# Patient Record
Sex: Female | Born: 1935 | Race: White | Hispanic: No | Marital: Married
Health system: Southern US, Community
[De-identification: ages and names within clinical notes are randomized; demographics above are authoritative.]

---

## 2018-02-17 ENCOUNTER — Other Ambulatory Visit (HOSPITAL_COMMUNITY): Payer: Medicare Other

## 2018-02-17 ENCOUNTER — Inpatient Hospital Stay
Admission: RE | Admit: 2018-02-17 | Discharge: 2018-03-12 | Disposition: A | Discharge status: Hospice | Payer: Medicare Other | Source: Ambulatory Visit | Attending: Internal Medicine | Admitting: Internal Medicine

## 2018-02-17 DIAGNOSIS — K567 Ileus, unspecified: Secondary | ICD-10-CM

## 2018-02-17 DIAGNOSIS — N170 Acute kidney failure with tubular necrosis: Secondary | ICD-10-CM

## 2018-02-17 DIAGNOSIS — K56609 Unspecified intestinal obstruction, unspecified as to partial versus complete obstruction: Secondary | ICD-10-CM

## 2018-02-17 DIAGNOSIS — J9600 Acute respiratory failure, unspecified whether with hypoxia or hypercapnia: Secondary | ICD-10-CM

## 2018-02-17 DIAGNOSIS — J189 Pneumonia, unspecified organism: Secondary | ICD-10-CM

## 2018-02-17 DIAGNOSIS — J90 Pleural effusion, not elsewhere classified: Secondary | ICD-10-CM

## 2018-02-17 DIAGNOSIS — J811 Chronic pulmonary edema: Secondary | ICD-10-CM

## 2018-02-17 DIAGNOSIS — Z431 Encounter for attention to gastrostomy: Secondary | ICD-10-CM

## 2018-02-17 DIAGNOSIS — N179 Acute kidney failure, unspecified: Secondary | ICD-10-CM

## 2018-02-17 DIAGNOSIS — K5649 Other impaction of intestine: Secondary | ICD-10-CM

## 2018-02-18 ENCOUNTER — Other Ambulatory Visit (HOSPITAL_COMMUNITY): Payer: Medicare Other

## 2018-02-18 LAB — CBC WITH DIFFERENTIAL/PLATELET
Abs Immature Granulocytes: 0.15 10*3/uL — ABNORMAL HIGH (ref 0.00–0.07)
Basophils Absolute: 0 10*3/uL (ref 0.0–0.1)
Basophils Relative: 0 %
Eosinophils Absolute: 0 10*3/uL (ref 0.0–0.5)
Eosinophils Relative: 0 %
HCT: 32.2 % — ABNORMAL LOW (ref 36.0–46.0)
Hemoglobin: 9.5 g/dL — ABNORMAL LOW (ref 12.0–15.0)
Immature Granulocytes: 1 %
LYMPHS PCT: 5 %
Lymphs Abs: 0.8 10*3/uL (ref 0.7–4.0)
MCH: 30.5 pg (ref 26.0–34.0)
MCHC: 29.5 g/dL — ABNORMAL LOW (ref 30.0–36.0)
MCV: 103.5 fL — ABNORMAL HIGH (ref 80.0–100.0)
Monocytes Absolute: 0.6 10*3/uL (ref 0.1–1.0)
Monocytes Relative: 4 %
Neutro Abs: 14.2 10*3/uL — ABNORMAL HIGH (ref 1.7–7.7)
Neutrophils Relative %: 90 %
Platelets: 225 10*3/uL (ref 150–400)
RBC: 3.11 MIL/uL — AB (ref 3.87–5.11)
RDW: 17.5 % — ABNORMAL HIGH (ref 11.5–15.5)
WBC: 15.8 10*3/uL — AB (ref 4.0–10.5)
nRBC: 0.5 % — ABNORMAL HIGH (ref 0.0–0.2)

## 2018-02-18 LAB — OCCULT BLOOD GASTRIC / DUODENUM (SPECIMEN CUP): Occult Blood, Gastric: NEGATIVE

## 2018-02-18 LAB — COMPREHENSIVE METABOLIC PANEL
ALT: 225 U/L — ABNORMAL HIGH (ref 0–44)
AST: 48 U/L — ABNORMAL HIGH (ref 15–41)
Albumin: 2.7 g/dL — ABNORMAL LOW (ref 3.5–5.0)
Alkaline Phosphatase: 58 U/L (ref 38–126)
Anion gap: 17 — ABNORMAL HIGH (ref 5–15)
BUN: 105 mg/dL — ABNORMAL HIGH (ref 8–23)
CHLORIDE: 104 mmol/L (ref 98–111)
CO2: 26 mmol/L (ref 22–32)
Calcium: 8.8 mg/dL — ABNORMAL LOW (ref 8.9–10.3)
Creatinine, Ser: 3.26 mg/dL — ABNORMAL HIGH (ref 0.44–1.00)
GFR calc Af Amer: 14 mL/min — ABNORMAL LOW (ref >60)
GFR calc non Af Amer: 12 mL/min — ABNORMAL LOW (ref >60)
Glucose, Bld: 148 mg/dL — ABNORMAL HIGH (ref 70–99)
Potassium: 4.4 mmol/L (ref 3.5–5.1)
Sodium: 147 mmol/L — ABNORMAL HIGH (ref 135–145)
Total Bilirubin: 1.2 mg/dL (ref 0.3–1.2)
Total Protein: 5.4 g/dL — ABNORMAL LOW (ref 6.5–8.1)

## 2018-02-18 LAB — MAGNESIUM: Magnesium: 2.3 mg/dL (ref 1.7–2.4)

## 2018-02-18 LAB — PHOSPHORUS: Phosphorus: 6 mg/dL — ABNORMAL HIGH (ref 2.5–4.6)

## 2018-02-18 LAB — HEMOGLOBIN A1C
Hgb A1c MFr Bld: 5.1 % (ref 4.8–5.6)
Mean Plasma Glucose: 99.67 mg/dL

## 2018-02-18 LAB — BRAIN NATRIURETIC PEPTIDE

## 2018-02-18 LAB — TSH: TSH: 0.896 u[IU]/mL (ref 0.350–4.500)

## 2018-02-18 LAB — PROTIME-INR
INR: 1.92
Prothrombin Time: 21.7 seconds — ABNORMAL HIGH (ref 11.4–15.2)

## 2018-02-19 ENCOUNTER — Other Ambulatory Visit (HOSPITAL_COMMUNITY): Payer: Medicare Other

## 2018-02-19 LAB — RENAL FUNCTION PANEL
Albumin: 2.5 g/dL — ABNORMAL LOW (ref 3.5–5.0)
Anion gap: 13 (ref 5–15)
BUN: 90 mg/dL — ABNORMAL HIGH (ref 8–23)
CALCIUM: 8.4 mg/dL — AB (ref 8.9–10.3)
CO2: 30 mmol/L (ref 22–32)
Chloride: 102 mmol/L (ref 98–111)
Creatinine, Ser: 2.87 mg/dL — ABNORMAL HIGH (ref 0.44–1.00)
GFR calc Af Amer: 17 mL/min — ABNORMAL LOW (ref >60)
GFR calc non Af Amer: 14 mL/min — ABNORMAL LOW (ref >60)
Glucose, Bld: 131 mg/dL — ABNORMAL HIGH (ref 70–99)
Phosphorus: 5.7 mg/dL — ABNORMAL HIGH (ref 2.5–4.6)
Potassium: 3.7 mmol/L (ref 3.5–5.1)
SODIUM: 145 mmol/L (ref 135–145)

## 2018-02-19 LAB — CBC
HCT: 29.1 % — ABNORMAL LOW (ref 36.0–46.0)
Hemoglobin: 8.7 g/dL — ABNORMAL LOW (ref 12.0–15.0)
MCH: 30.7 pg (ref 26.0–34.0)
MCHC: 29.9 g/dL — ABNORMAL LOW (ref 30.0–36.0)
MCV: 102.8 fL — ABNORMAL HIGH (ref 80.0–100.0)
NRBC: 0.1 % (ref 0.0–0.2)
Platelets: 229 10*3/uL (ref 150–400)
RBC: 2.83 MIL/uL — ABNORMAL LOW (ref 3.87–5.11)
RDW: 17 % — ABNORMAL HIGH (ref 11.5–15.5)
WBC: 18.2 10*3/uL — AB (ref 4.0–10.5)

## 2018-02-19 LAB — MAGNESIUM: Magnesium: 2.2 mg/dL (ref 1.7–2.4)

## 2018-02-20 ENCOUNTER — Institutional Professional Consult (permissible substitution) (HOSPITAL_COMMUNITY): Payer: Medicare Other

## 2018-02-20 LAB — VANCOMYCIN, TROUGH: VANCOMYCIN TR: 21 ug/mL — AB (ref 15–20)

## 2018-02-20 LAB — CBC
HCT: 33.2 % — ABNORMAL LOW (ref 36.0–46.0)
HEMOGLOBIN: 10.1 g/dL — AB (ref 12.0–15.0)
MCH: 31.1 pg (ref 26.0–34.0)
MCHC: 30.4 g/dL (ref 30.0–36.0)
MCV: 102.2 fL — ABNORMAL HIGH (ref 80.0–100.0)
Platelets: 211 10*3/uL (ref 150–400)
RBC: 3.25 MIL/uL — ABNORMAL LOW (ref 3.87–5.11)
RDW: 16.2 % — ABNORMAL HIGH (ref 11.5–15.5)
WBC: 18.1 10*3/uL — ABNORMAL HIGH (ref 4.0–10.5)
nRBC: 0.1 % (ref 0.0–0.2)

## 2018-02-20 LAB — BASIC METABOLIC PANEL
Anion gap: 15 (ref 5–15)
BUN: 79 mg/dL — ABNORMAL HIGH (ref 8–23)
CALCIUM: 8.6 mg/dL — AB (ref 8.9–10.3)
CO2: 31 mmol/L (ref 22–32)
Chloride: 96 mmol/L — ABNORMAL LOW (ref 98–111)
Creatinine, Ser: 2.51 mg/dL — ABNORMAL HIGH (ref 0.44–1.00)
GFR calc non Af Amer: 17 mL/min — ABNORMAL LOW (ref >60)
GFR, EST AFRICAN AMERICAN: 20 mL/min — AB (ref >60)
Glucose, Bld: 124 mg/dL — ABNORMAL HIGH (ref 70–99)
Potassium: 3.9 mmol/L (ref 3.5–5.1)
Sodium: 142 mmol/L (ref 135–145)

## 2018-02-20 LAB — MAGNESIUM: Magnesium: 2.1 mg/dL (ref 1.7–2.4)

## 2018-02-21 ENCOUNTER — Other Ambulatory Visit (HOSPITAL_COMMUNITY): Payer: Medicare Other

## 2018-02-21 LAB — CBC
HCT: 32.4 % — ABNORMAL LOW (ref 36.0–46.0)
HCT: 33.9 % — ABNORMAL LOW (ref 36.0–46.0)
Hemoglobin: 9.8 g/dL — ABNORMAL LOW (ref 12.0–15.0)
Hemoglobin: 10.2 g/dL — ABNORMAL LOW (ref 12.0–15.0)
MCH: 30.9 pg (ref 26.0–34.0)
MCH: 31.4 pg (ref 26.0–34.0)
MCHC: 30.2 g/dL (ref 30.0–36.0)
MCHC: 30.1 g/dL (ref 30.0–36.0)
MCV: 102.2 fL — ABNORMAL HIGH (ref 80.0–100.0)
MCV: 104.3 fL — ABNORMAL HIGH (ref 80.0–100.0)
PLATELETS: 171 10*3/uL (ref 150–400)
Platelets: 194 10*3/uL (ref 150–400)
RBC: 3.17 MIL/uL — ABNORMAL LOW (ref 3.87–5.11)
RBC: 3.25 MIL/uL — ABNORMAL LOW (ref 3.87–5.11)
RDW: 16.1 % — AB (ref 11.5–15.5)
RDW: 15.9 % — AB (ref 11.5–15.5)
WBC: 16.2 10*3/uL — ABNORMAL HIGH (ref 4.0–10.5)
WBC: 19.2 10*3/uL — ABNORMAL HIGH (ref 4.0–10.5)
nRBC: 0 % (ref 0.0–0.2)
nRBC: 0 % (ref 0.0–0.2)

## 2018-02-21 LAB — MAGNESIUM: Magnesium: 2 mg/dL (ref 1.7–2.4)

## 2018-02-21 LAB — URINALYSIS, ROUTINE W REFLEX MICROSCOPIC
Bilirubin Urine: NEGATIVE
Glucose, UA: 50 mg/dL — AB
Ketones, ur: NEGATIVE mg/dL
Leukocytes, UA: NEGATIVE
Nitrite: NEGATIVE
Protein, ur: 30 mg/dL — AB
Specific Gravity, Urine: 1.015 (ref 1.005–1.030)
pH: 5 (ref 5.0–8.0)

## 2018-02-21 LAB — RENAL FUNCTION PANEL
ALBUMIN: 2.9 g/dL — AB (ref 3.5–5.0)
Anion gap: 13 (ref 5–15)
BUN: 71 mg/dL — AB (ref 8–23)
CO2: 31 mmol/L (ref 22–32)
Calcium: 8.6 mg/dL — ABNORMAL LOW (ref 8.9–10.3)
Chloride: 99 mmol/L (ref 98–111)
Creatinine, Ser: 2.23 mg/dL — ABNORMAL HIGH (ref 0.44–1.00)
GFR calc Af Amer: 23 mL/min — ABNORMAL LOW (ref >60)
GFR calc non Af Amer: 19 mL/min — ABNORMAL LOW (ref >60)
Glucose, Bld: 124 mg/dL — ABNORMAL HIGH (ref 70–99)
Phosphorus: 5.3 mg/dL — ABNORMAL HIGH (ref 2.5–4.6)
Potassium: 3.9 mmol/L (ref 3.5–5.1)
SODIUM: 143 mmol/L (ref 135–145)

## 2018-02-22 ENCOUNTER — Other Ambulatory Visit (HOSPITAL_COMMUNITY): Payer: Medicare Other

## 2018-02-22 ENCOUNTER — Institutional Professional Consult (permissible substitution) (HOSPITAL_COMMUNITY): Payer: Medicare Other

## 2018-02-22 LAB — RENAL FUNCTION PANEL
Albumin: 2.5 g/dL — ABNORMAL LOW (ref 3.5–5.0)
Anion gap: 11 (ref 5–15)
BUN: 62 mg/dL — ABNORMAL HIGH (ref 8–23)
CO2: 33 mmol/L — ABNORMAL HIGH (ref 22–32)
Calcium: 8.4 mg/dL — ABNORMAL LOW (ref 8.9–10.3)
Chloride: 104 mmol/L (ref 98–111)
Creatinine, Ser: 1.88 mg/dL — ABNORMAL HIGH (ref 0.44–1.00)
GFR calc non Af Amer: 24 mL/min — ABNORMAL LOW (ref >60)
GFR, EST AFRICAN AMERICAN: 28 mL/min — AB (ref >60)
Glucose, Bld: 119 mg/dL — ABNORMAL HIGH (ref 70–99)
Phosphorus: 4.5 mg/dL (ref 2.5–4.6)
Potassium: 3.7 mmol/L (ref 3.5–5.1)
Sodium: 148 mmol/L — ABNORMAL HIGH (ref 135–145)

## 2018-02-22 LAB — URINE CULTURE: Culture: NO GROWTH

## 2018-02-22 LAB — CBC
HCT: 30.6 % — ABNORMAL LOW (ref 36.0–46.0)
Hemoglobin: 9.1 g/dL — ABNORMAL LOW (ref 12.0–15.0)
MCH: 31 pg (ref 26.0–34.0)
MCHC: 29.7 g/dL — ABNORMAL LOW (ref 30.0–36.0)
MCV: 104.1 fL — ABNORMAL HIGH (ref 80.0–100.0)
Platelets: 148 10*3/uL — ABNORMAL LOW (ref 150–400)
RBC: 2.94 MIL/uL — AB (ref 3.87–5.11)
RDW: 16 % — ABNORMAL HIGH (ref 11.5–15.5)
WBC: 13.8 10*3/uL — ABNORMAL HIGH (ref 4.0–10.5)
nRBC: 0 % (ref 0.0–0.2)

## 2018-02-23 ENCOUNTER — Institutional Professional Consult (permissible substitution) (HOSPITAL_COMMUNITY): Payer: Medicare Other

## 2018-02-23 ENCOUNTER — Other Ambulatory Visit (HOSPITAL_COMMUNITY): Payer: Medicare Other

## 2018-02-23 LAB — BASIC METABOLIC PANEL
ANION GAP: 10 (ref 5–15)
BUN: 58 mg/dL — ABNORMAL HIGH (ref 8–23)
CO2: 33 mmol/L — AB (ref 22–32)
Calcium: 8.7 mg/dL — ABNORMAL LOW (ref 8.9–10.3)
Chloride: 108 mmol/L (ref 98–111)
Creatinine, Ser: 1.73 mg/dL — ABNORMAL HIGH (ref 0.44–1.00)
GFR calc Af Amer: 31 mL/min — ABNORMAL LOW (ref >60)
GFR calc non Af Amer: 26 mL/min — ABNORMAL LOW (ref >60)
Glucose, Bld: 153 mg/dL — ABNORMAL HIGH (ref 70–99)
Potassium: 3.6 mmol/L (ref 3.5–5.1)
Sodium: 151 mmol/L — ABNORMAL HIGH (ref 135–145)

## 2018-02-23 LAB — MAGNESIUM: Magnesium: 2 mg/dL (ref 1.7–2.4)

## 2018-02-23 LAB — PHOSPHORUS: Phosphorus: 3.2 mg/dL (ref 2.5–4.6)

## 2018-02-23 LAB — TRIGLYCERIDES: Triglycerides: 49 mg/dL (ref <150)

## 2018-02-23 MED ORDER — DIATRIZOATE MEGLUMINE & SODIUM 66-10 % PO SOLN
ORAL | Status: AC
  Filled 2018-02-23: qty 120

## 2018-02-24 ENCOUNTER — Other Ambulatory Visit (HOSPITAL_COMMUNITY): Payer: Medicare Other

## 2018-02-24 LAB — BASIC METABOLIC PANEL
Anion gap: 9 (ref 5–15)
BUN: 56 mg/dL — ABNORMAL HIGH (ref 8–23)
CO2: 34 mmol/L — AB (ref 22–32)
Calcium: 8.5 mg/dL — ABNORMAL LOW (ref 8.9–10.3)
Chloride: 108 mmol/L (ref 98–111)
Creatinine, Ser: 1.55 mg/dL — ABNORMAL HIGH (ref 0.44–1.00)
GFR calc Af Amer: 35 mL/min — ABNORMAL LOW (ref >60)
GFR calc non Af Amer: 30 mL/min — ABNORMAL LOW (ref >60)
Glucose, Bld: 145 mg/dL — ABNORMAL HIGH (ref 70–99)
Potassium: 4.3 mmol/L (ref 3.5–5.1)
Sodium: 151 mmol/L — ABNORMAL HIGH (ref 135–145)

## 2018-02-24 LAB — MAGNESIUM: Magnesium: 1.9 mg/dL (ref 1.7–2.4)

## 2018-02-25 ENCOUNTER — Institutional Professional Consult (permissible substitution) (HOSPITAL_COMMUNITY): Payer: Medicare Other

## 2018-02-25 LAB — URINALYSIS, ROUTINE W REFLEX MICROSCOPIC
Bilirubin Urine: NEGATIVE
Glucose, UA: 500 mg/dL — AB
Ketones, ur: NEGATIVE mg/dL
Nitrite: NEGATIVE
PROTEIN: NEGATIVE mg/dL
Specific Gravity, Urine: 1.01 (ref 1.005–1.030)
pH: 6 (ref 5.0–8.0)

## 2018-02-25 LAB — RENAL FUNCTION PANEL
ANION GAP: 8 (ref 5–15)
Albumin: 2.3 g/dL — ABNORMAL LOW (ref 3.5–5.0)
BUN: 56 mg/dL — ABNORMAL HIGH (ref 8–23)
CO2: 30 mmol/L (ref 22–32)
Calcium: 8.1 mg/dL — ABNORMAL LOW (ref 8.9–10.3)
Chloride: 108 mmol/L (ref 98–111)
Creatinine, Ser: 1.32 mg/dL — ABNORMAL HIGH (ref 0.44–1.00)
GFR calc Af Amer: 43 mL/min — ABNORMAL LOW (ref >60)
GFR calc non Af Amer: 37 mL/min — ABNORMAL LOW (ref >60)
Glucose, Bld: 135 mg/dL — ABNORMAL HIGH (ref 70–99)
PHOSPHORUS: 3.3 mg/dL (ref 2.5–4.6)
POTASSIUM: 4 mmol/L (ref 3.5–5.1)
Sodium: 146 mmol/L — ABNORMAL HIGH (ref 135–145)

## 2018-02-25 LAB — URINALYSIS, MICROSCOPIC (REFLEX)

## 2018-02-25 LAB — CBC
HCT: 29.4 % — ABNORMAL LOW (ref 36.0–46.0)
Hemoglobin: 8.7 g/dL — ABNORMAL LOW (ref 12.0–15.0)
MCH: 31.2 pg (ref 26.0–34.0)
MCHC: 29.6 g/dL — ABNORMAL LOW (ref 30.0–36.0)
MCV: 105.4 fL — AB (ref 80.0–100.0)
Platelets: 104 10*3/uL — ABNORMAL LOW (ref 150–400)
RBC: 2.79 MIL/uL — ABNORMAL LOW (ref 3.87–5.11)
RDW: 15.8 % — ABNORMAL HIGH (ref 11.5–15.5)
WBC: 14.3 10*3/uL — ABNORMAL HIGH (ref 4.0–10.5)
nRBC: 0 % (ref 0.0–0.2)

## 2018-02-25 LAB — MAGNESIUM: Magnesium: 1.9 mg/dL (ref 1.7–2.4)

## 2018-02-26 ENCOUNTER — Other Ambulatory Visit (HOSPITAL_COMMUNITY): Payer: Medicare Other

## 2018-02-26 LAB — CBC
HCT: 28.7 % — ABNORMAL LOW (ref 36.0–46.0)
Hemoglobin: 8.7 g/dL — ABNORMAL LOW (ref 12.0–15.0)
MCH: 31.8 pg (ref 26.0–34.0)
MCHC: 30.3 g/dL (ref 30.0–36.0)
MCV: 104.7 fL — ABNORMAL HIGH (ref 80.0–100.0)
Platelets: 93 10*3/uL — ABNORMAL LOW (ref 150–400)
RBC: 2.74 MIL/uL — ABNORMAL LOW (ref 3.87–5.11)
RDW: 15.6 % — ABNORMAL HIGH (ref 11.5–15.5)
WBC: 15 10*3/uL — ABNORMAL HIGH (ref 4.0–10.5)
nRBC: 0 % (ref 0.0–0.2)

## 2018-02-26 LAB — RENAL FUNCTION PANEL
Albumin: 2.4 g/dL — ABNORMAL LOW (ref 3.5–5.0)
Anion gap: 7 (ref 5–15)
BUN: 61 mg/dL — ABNORMAL HIGH (ref 8–23)
CHLORIDE: 105 mmol/L (ref 98–111)
CO2: 29 mmol/L (ref 22–32)
Calcium: 8 mg/dL — ABNORMAL LOW (ref 8.9–10.3)
Creatinine, Ser: 1.34 mg/dL — ABNORMAL HIGH (ref 0.44–1.00)
GFR calc Af Amer: 42 mL/min — ABNORMAL LOW (ref >60)
GFR calc non Af Amer: 36 mL/min — ABNORMAL LOW (ref >60)
Glucose, Bld: 118 mg/dL — ABNORMAL HIGH (ref 70–99)
Phosphorus: 4.3 mg/dL (ref 2.5–4.6)
Potassium: 4.9 mmol/L (ref 3.5–5.1)
Sodium: 141 mmol/L (ref 135–145)

## 2018-02-26 LAB — URINE CULTURE: Culture: <10000 — AB

## 2018-02-26 LAB — T4, FREE: Free T4: 2.42 ng/dL — ABNORMAL HIGH (ref 0.82–1.77)

## 2018-02-26 LAB — TSH: TSH: 0.638 u[IU]/mL (ref 0.350–4.500)

## 2018-02-26 LAB — MAGNESIUM: MAGNESIUM: 2.1 mg/dL (ref 1.7–2.4)

## 2018-02-28 LAB — C DIFFICILE QUICK SCREEN W PCR REFLEX
C DIFFICILE (CDIFF) INTERP: NOT DETECTED
C Diff antigen: NEGATIVE
C Diff toxin: NEGATIVE

## 2018-03-01 ENCOUNTER — Institutional Professional Consult (permissible substitution) (HOSPITAL_COMMUNITY): Payer: Medicare Other

## 2018-03-01 LAB — MAGNESIUM: Magnesium: 1.9 mg/dL (ref 1.7–2.4)

## 2018-03-01 LAB — RENAL FUNCTION PANEL
Albumin: 2.5 g/dL — ABNORMAL LOW (ref 3.5–5.0)
Anion gap: 11 (ref 5–15)
BUN: 79 mg/dL — ABNORMAL HIGH (ref 8–23)
CO2: 26 mmol/L (ref 22–32)
CREATININE: 1.29 mg/dL — AB (ref 0.44–1.00)
Calcium: 8.3 mg/dL — ABNORMAL LOW (ref 8.9–10.3)
Chloride: 105 mmol/L (ref 98–111)
GFR calc Af Amer: 44 mL/min — ABNORMAL LOW (ref >60)
GFR, EST NON AFRICAN AMERICAN: 38 mL/min — AB (ref >60)
Glucose, Bld: 120 mg/dL — ABNORMAL HIGH (ref 70–99)
Phosphorus: 5.6 mg/dL — ABNORMAL HIGH (ref 2.5–4.6)
Potassium: 3.8 mmol/L (ref 3.5–5.1)
Sodium: 142 mmol/L (ref 135–145)

## 2018-03-01 LAB — CBC
HCT: 29 % — ABNORMAL LOW (ref 36.0–46.0)
Hemoglobin: 8.6 g/dL — ABNORMAL LOW (ref 12.0–15.0)
MCH: 30.8 pg (ref 26.0–34.0)
MCHC: 29.7 g/dL — ABNORMAL LOW (ref 30.0–36.0)
MCV: 103.9 fL — ABNORMAL HIGH (ref 80.0–100.0)
Platelets: 91 10*3/uL — ABNORMAL LOW (ref 150–400)
RBC: 2.79 MIL/uL — AB (ref 3.87–5.11)
RDW: 16 % — ABNORMAL HIGH (ref 11.5–15.5)
WBC: 13.9 10*3/uL — ABNORMAL HIGH (ref 4.0–10.5)
nRBC: 0.5 % — ABNORMAL HIGH (ref 0.0–0.2)

## 2018-03-02 ENCOUNTER — Other Ambulatory Visit (HOSPITAL_COMMUNITY): Payer: Medicare Other

## 2018-03-02 LAB — TRIGLYCERIDES: Triglycerides: 44 mg/dL (ref <150)

## 2018-03-02 NOTE — Consult Note (Signed)
Chief Complaint: Patient was seen in consultation today for percutaneous gastric tube placement at the request of Dr Ardeth Sportsman   Supervising Physician: Richarda Overlie  Patient Status: Select IP  History of Present Illness: Bailey Bartlett is a 84 y.o. female   HTN; CHF Resp Failure General weakness Dysphagia Deconditioning Protein calorie malnutrition Need for long term care  Request for percutaneous gastric tube placement   No past medical history on file.    Allergies: Patient has no allergy information on record.  Medications: Prior to Admission medications   Not on File     No family history on file.  Social History   Socioeconomic History  . Marital status: Married    Spouse name: Not on file  . Number of children: Not on file  . Years of education: Not on file  . Highest education level: Not on file  Occupational History  . Not on file  Social Needs  . Financial resource strain: Not on file  . Food insecurity:    Worry: Not on file    Inability: Not on file  . Transportation needs:    Medical: Not on file    Non-medical: Not on file  Tobacco Use  . Smoking status: Not on file  Substance and Sexual Activity  . Alcohol use: Not on file  . Drug use: Not on file  . Sexual activity: Not on file  Lifestyle  . Physical activity:    Days per week: Not on file    Minutes per session: Not on file  . Stress: Not on file  Relationships  . Social connections:    Talks on phone: Not on file    Gets together: Not on file    Attends religious service: Not on file    Active member of club or organization: Not on file    Attends meetings of clubs or organizations: Not on file    Relationship status: Not on file  Other Topics Concern  . Not on file  Social History Narrative  . Not on file    Review of Systems: A 12 point ROS discussed and pertinent positives are indicated in the HPI above.  All other systems are negative.   Vital Signs: LMP   (LMP Unknown)   Physical Exam Vitals signs reviewed.  Cardiovascular:     Rate and Rhythm: Normal rate and regular rhythm.  Pulmonary:     Effort: Pulmonary effort is normal.     Breath sounds: Wheezing present.  Abdominal:     General: Bowel sounds are normal.  Musculoskeletal:     Comments: Moves all 4s to command  Skin:    General: Skin is warm and dry.  Neurological:     Mental Status: She is disoriented.  Psychiatric:     Comments: Consented with Dtr Claris Che via phone     Imaging: Ct Abdomen Pelvis Wo Contrast  Result Date: 02/20/2018 CLINICAL DATA:  Abdominal discomfort, bowel obstruction EXAM: CT ABDOMEN AND PELVIS WITHOUT CONTRAST TECHNIQUE: Multidetector CT imaging of the abdomen and pelvis was performed following the standard protocol without IV contrast. COMPARISON:  Abdominal radiograph dated 02/19/2018 FINDINGS: Lower chest: Small right pleural effusion. Associated patchy right lower lobe opacity, likely compressive atelectasis. Hepatobiliary: Unenhanced liver is unremarkable. Status post cholecystectomy. No intrahepatic or extrahepatic ductal dilatation. Pancreas: Within normal limits. Spleen: Within normal limits. Adrenals/Urinary Tract: Adrenal glands are within normal limits. 2.5 cm posterior left upper pole renal cyst (series 3/image 33), measuring simple  fluid density. Right kidney is within normal limits. No renal calculi or hydronephrosis. Bladder is obscured by streak artifact. Stomach/Bowel: Enteric tube terminates in the distal gastric body. Multiple dilated loops of small bowel with transition in the right mid abdomen (series 3/image 40). Distal/terminal ileum is decompressed. Normal appendix (series 3/image 41). Colon is relatively decompressed with mild colonic stool burden. Overall appearance is compatible with at least partial small bowel obstruction. No pneumatosis. No drainable fluid collection/abscess. No free air. Vascular/Lymphatic: No evidence of  abdominal aortic aneurysm. Atherosclerotic calcifications of the abdominal aorta and branch vessels. IVC filter. No suspicious abdominopelvic lymphadenopathy. Reproductive: Uterus is unremarkable. Bilateral ovaries are unremarkable. Other: Small volume pelvic ascites. Musculoskeletal: Bilateral hip arthroplasties. Old bilateral superior and right inferior pubic rami fractures. IMPRESSION: Dilated loops of small bowel with transition involving distal/terminal ileum in the right mid abdomen, compatible with at least partial small bowel obstruction. Small volume pelvic ascites. No pneumatosis. No drainable fluid collection/abscess. No free air. Small right pleural effusion. Associated patchy right lower lobe opacity, likely compressive atelectasis. Electronically Signed   By: Charline BillsSriyesh  Krishnan M.D.   On: 02/20/2018 15:57   Ct Abdomen Wo Contrast  Result Date: 03/02/2018 CLINICAL DATA:  84 year old female with dysphagia in need of percutaneous gastrostomy tube for nutrition. Evaluate for anatomic suitability. Recent partial small bowel obstruction. EXAM: CT ABDOMEN WITHOUT CONTRAST TECHNIQUE: Multidetector CT imaging of the abdomen was performed following the standard protocol without IV contrast. COMPARISON:  Recent prior CT scan of the abdomen and pelvis 02/20/2018 FINDINGS: Lower chest: Small layering right pleural effusion. Expected associated right lower lobe atelectasis. Patchy ground-glass attenuation airspace opacity in the left lower lobe is new compared to prior. Stable cardiomegaly with right atrial dilatation. The visualized thoracic esophagus conveys a gastric tube into the stomach. No esophageal thickening. Hepatobiliary: No focal liver abnormality is seen. Status post cholecystectomy. No biliary dilatation. Pancreas: Unremarkable. No pancreatic ductal dilatation or surrounding inflammatory changes. Spleen: Normal in size without focal abnormality. Adrenals/Urinary Tract: Normal adrenal glands. No  evidence of hydronephrosis or nephrolithiasis. Water attenuation cystic lesion in the interpolar left kidney is incompletely evaluated in the absence of intravenous contrast but statistically likely to represent a benign cyst. Stomach/Bowel: Interval resolution of small bowel obstruction. Normal gastric anatomy. No focal bowel wall thickening. Normal appendix in the right lower quadrant. Vascular/Lymphatic: Limited evaluation in the absence of intravenous contrast. Extensive atherosclerotic calcifications including of the aorta. A non retrievable permanent Simon Nitinol IVC filter is present. There is penetration of the struts. No strut complication visualized. No suspicious lymphadenopathy. Other: Bilateral calcified breast augmentation prostheses. Probable rupture of the left prosthesis with associated skin thickening. Musculoskeletal: No acute fracture or aggressive appearing lytic or blastic osseous lesion. IMPRESSION: 1. Anatomy is suitable for gastrostomy tube placement. 2. New patchy ground-glass attenuation airspace opacity in the left lower lobe concerning for bronchopneumonia versus aspiration. 3. Ruptured left breast augmentation prosthesis with reticulation of the overlying subcutaneous fat and focal skin thickening. Recommend clinical correlation for evidence of local irritation, cellulitis, or mass/malignancy. 4. Non retrievable Simon Nitinol IVC filter in place. Electronically Signed   By: Malachy MoanHeath  McCullough M.D.   On: 03/02/2018 08:04   Dg Abd 1 View  Result Date: 02/17/2018 CLINICAL DATA:  Bowel impaction EXAM: ABDOMEN - 1 VIEW COMPARISON:  None. FINDINGS: Diffuse gaseous distention of bowel, most notable in the colon. Findings likely reflect ileus. No free air or organomegaly. IVC filter in place. Prior cholecystectomy. Vascular calcifications. Bilateral hip replacements. No  acute bony abnormality. IMPRESSION: Diffuse gaseous distention of bowel, most pronounced in the large bowel, likely  ileus. Electronically Signed   By: Charlett Nose M.D.   On: 02/17/2018 20:42   US Renal  Result Date: 02/18/2018 CLINICAL DATA:  Acute kidney injury. EXAM: RENAL / URINARY TRACT ULTRASOUND COMPLETE COMPARISON:  None. FINDINGS: Right Kidney: Renal measurements: 11.2 x 9.6 x 5.9 cm = volume: 125 mL . Echogenicity within normal limits. No mass or hydronephrosis visualized. Left Kidney: Renal measurements: 10.1 x 5.9 x 4.3 cm = volume: 133 mL. Echogenicity within normal limits. 2.4 cm complex cystic abnormality is noted in upper pole most consistent with Bosniak type 2 cyst. No hydronephrosis visualized. Bladder: Decompressed secondary to Foley catheter. IMPRESSION: 2.4 cm complex cystic abnormality seen in upper pole of left kidney most consistent with Bosniak type 2 cyst. Imaging is somewhat limited due to body habitus. Follow-up ultrasound in 1 year is recommended to ensure stability. No other definite renal abnormality is noted. Electronically Signed   By: Lupita Raider, M.D.   On: 02/18/2018 15:34   Dg Chest Port 1 View  Result Date: 02/23/2018 CLINICAL DATA:  Pulmonary edema, shortness of breath, wheezing EXAM: PORTABLE CHEST 1 VIEW COMPARISON:  02/20/2018 FINDINGS: Right PICC line and NG tube remain in place, unchanged. Cardiomegaly, vascular congestion and bibasilar atelectasis. Improved aeration in the lung bases since prior study. IMPRESSION: Cardiomegaly with vascular congestion. Improving bibasilar atelectasis. Electronically Signed   By: Charlett Nose M.D.   On: 02/23/2018 09:02   Dg Chest Port 1 View  Result Date: 02/20/2018 CLINICAL DATA:  Pulmonary edema, effusion, shortness of breath EXAM: PORTABLE CHEST 1 VIEW COMPARISON:  02/17/2018 FINDINGS: Right PICC line remains in place, unchanged. Interval placement of NG tube into the stomach. Cardiomegaly. Bibasilar atelectasis or infiltrates, similar on the left, increasing on the right. There may be a small right pleural effusion. No acute  bony abnormality. Stable deformity of the proximal right humerus related to old healed fracture. IMPRESSION: Bibasilar opacities, right greater than left could reflect atelectasis or infiltrates. Possible small right effusion. Electronically Signed   By: Charlett Nose M.D.   On: 02/20/2018 08:34   Dg Chest Port 1 View  Result Date: 02/17/2018 CLINICAL DATA:  Pneumonia, respiratory failure EXAM: PORTABLE CHEST 1 VIEW COMPARISON:  None. FINDINGS: Cardiomegaly. Patchy left lower lung airspace opacity concerning for pneumonia. No confluent opacity on the right. No effusions. Right PICC line in place with the tip at the cavoatrial junction. IMPRESSION: Cardiomegaly. Left lower lobe airspace opacity concerning for pneumonia. Electronically Signed   By: Charlett Nose M.D.   On: 02/17/2018 20:41   Dg Abd Portable 1v  Result Date: 02/26/2018 CLINICAL DATA:  Ileus EXAM: PORTABLE ABDOMEN - 1 VIEW COMPARISON:  February 25, 2018 FINDINGS: Nasogastric tube tip and side port are in the stomach. There is currently no appreciable bowel dilatation or air-fluid level to suggest bowel obstruction. No free air. Inferior vena cava position unchanged. 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 Lung bases are clear. Calcified breast implants noted. IMPRESSION: Nasogastric tube tip and side port in stomach. No bowel obstruction or free air evident. Lung bases clear. Postoperative changes noted. Electronically Signed   By: Bretta Bang III M.D.   On: 02/26/2018 07:24   Dg Abd Portable 1v  Result Date: 02/25/2018 CLINICAL DATA:  Small-bowel obstruction EXAM: PORTABLE ABDOMEN - 1 VIEW COMPARISON:  02/24/2018 FINDINGS: NG in the stomach with the tip in the antrum unchanged. Normal bowel  gas pattern. Improvement in small bowel dilatation compared with prior study. Decreased contrast in the colon. IVC filter unchanged the L3-4 level. Bilateral hip replacement. Chronic right pelvic fracture. IMPRESSION: NG tube remains in the  gastric antrum. Improvement in small bowel dilatation with passage of colonic contrast since the prior study. Electronically Signed   By: Marlan Palauharles  Clark M.D.   On: 02/25/2018 08:35   Dg Abd Portable 1v  Result Date: 02/24/2018 CLINICAL DATA:  Small bowel obstruction. EXAM: PORTABLE ABDOMEN - 1 VIEW COMPARISON:  02/23/2018 FINDINGS: Nasogastric tube courses through the stomach with tip over the distal stomach just right of midline. IVC filter unchanged with tip at the level of the L3 superior endplate. Bowel gas pattern demonstrates air and contrast throughout the colon. There are a few air-filled mildly dilated small bowel loops in the right mid abdomen with slight interval improvement. Findings are not significantly changed. No free peritoneal air. Remainder of the exam is unchanged. IMPRESSION: Persistent air-filled minimally dilated small bowel loops in the right mid abdomen with slight interval improvement. Air and contrast throughout the colon. Nasogastric tube with tip right of midline likely over the distal stomach. Electronically Signed   By: Elberta Fortisaniel  Boyle M.D.   On: 02/24/2018 14:49   Dg Abd Portable 1v-small Bowel Obstruction Protocol-initial, 8 Hr Delay  Result Date: 02/23/2018 CLINICAL DATA:  Small bowel protocol, 8 hour delay EXAM: PORTABLE ABDOMEN - 1 VIEW COMPARISON:  02/23/2018, CT 02/20/2018 FINDINGS: Motion degraded study. Tip of the esophageal tube likely over the distal stomach. IVC filter to the right of L3-L4. Dilute contrast within dilated small bowel. No definite colon contrast. Bilateral hip replacements. IMPRESSION: Contrast present within dilated small bowel loops without significant interval change since prior radiograph. No significant contrast demonstrated in the colon. Electronically Signed   By: Jasmine PangKim  Fujinaga M.D.   On: 02/23/2018 21:10   Dg Abd Portable 1v  Result Date: 02/23/2018 CLINICAL DATA:  Ileus EXAM: PORTABLE ABDOMEN - 1 VIEW COMPARISON:  02/22/2018 FINDINGS:  NG tube remains in the stomach. Gaseous distention of bowel, predominantly small bowel concerning for small bowel obstruction. No real change. No free air. IVC filter again noted. No acute bony abnormality. Bilateral hip replacements. IMPRESSION: Stable small bowel obstruction pattern. Electronically Signed   By: Charlett NoseKevin  Dover M.D.   On: 02/23/2018 07:39   Dg Abd Portable 1v  Result Date: 02/22/2018 CLINICAL DATA:  Follow-up bowel obstruction. EXAM: PORTABLE ABDOMEN - 1 VIEW COMPARISON:  02/21/2018 FINDINGS: Nasogastric tube enters the stomach in has its tip in the antrum. There is persistent gaseous distension of the small intestine consistent with high-grade small bowel obstruction. Some fecal material is present within the colon. IVC filter is in place. IMPRESSION: Persistent small bowel obstruction pattern. Electronically Signed   By: Paulina FusiMark  Shogry M.D.   On: 02/22/2018 10:05   Dg Abd Portable 1v  Result Date: 02/21/2018 CLINICAL DATA:  Bowel obstruction EXAM: PORTABLE ABDOMEN - 1 VIEW COMPARISON:  02/19/2018 FINDINGS: Dilated small bowel loops throughout the abdomen and pelvis compatible with small bowel obstruction. NG tube remains in the stomach. Prior cholecystectomy. No visible free air. IMPRESSION: Continued small bowel obstruction pattern, stable since prior study. Electronically Signed   By: Charlett NoseKevin  Dover M.D.   On: 02/21/2018 07:57   Dg Abd Portable 1v  Result Date: 02/19/2018 CLINICAL DATA:  Ileus EXAM: PORTABLE ABDOMEN - 1 VIEW COMPARISON:  KUB 02/17/2018 FINDINGS: NG coiled in the stomach. Progression of distended small bowel loops. Decreased colonic distention.  IVC filter at L3-4. Surgical clips right upper quadrant. Bilateral hip replacement IMPRESSION: Mild progression of small bowel distension. Decreased colonic distention. Possible small bowel obstruction versus ileus. Electronically Signed   By: Marlan Palau M.D.   On: 02/19/2018 13:09    Labs:  CBC: Recent Labs     02/22/18 0453 02/25/18 0700 02/26/18 0921 03/01/18 0424  WBC 13.8* 14.3* 15.0* 13.9*  HGB 9.1* 8.7* 8.7* 8.6*  HCT 30.6* 29.4* 28.7* 29.0*  PLT 148* 104* 93* 91*    COAGS: Recent Labs    02/18/18 0507  INR 1.92    BMP: Recent Labs    02/24/18 0457 02/25/18 0700 02/26/18 0636 03/01/18 0424  NA 151* 146* 141 142  K 4.3 4.0 4.9 3.8  CL 108 108 105 105  CO2 34* 30 29 26   GLUCOSE 145* 135* 118* 120*  BUN 56* 56* 61* 79*  CALCIUM 8.5* 8.1* 8.0* 8.3*  CREATININE 1.55* 1.32* 1.34* 1.29*  GFRNONAA 30* 37* 36* 38*  GFRAA 35* 43* 42* 44*    LIVER FUNCTION TESTS: Recent Labs    02/18/18 0507  02/22/18 0453 02/25/18 0700 02/26/18 0636 03/01/18 0424  BILITOT 1.2  --   --   --   --   --   AST 48*  --   --   --   --   --   ALT 225*  --   --   --   --   --   ALKPHOS 58  --   --   --   --   --   PROT 5.4*  --   --   --   --   --   ALBUMIN 2.7*   < > 2.5* 2.3* 2.4* 2.5*   < > = values in this interval not displayed.    TUMOR MARKERS: No results for input(s): AFPTM, CEA, CA199, CHROMGRNA in the last 8760 hours.  Assessment and Plan:  PCM Dysphagia Deconditioning And need for long term care Scheduled for percutaneous gastric tube placement Risks and benefits discussed with the patient's daughter Claris Che including, but not limited to the need for a barium enema during the procedure, bleeding, infection, peritonitis, or damage to adjacent structures.  All of her questions were answered, she is agreeable to proceed. Consent signed and in chart.  DC Eliquis now Recheck labs 12-30 am Tentative Plan for 12/30  Thank you for this interesting consult.  I greatly enjoyed meeting VERBIE BABIC and look forward to participating in their care.  A copy of this report was sent to the requesting provider on this date.  Electronically Signed: Robet Leu, PA-C 03/02/2018, 11:31 AM   I spent a total of 40 Minutes    in face to face in clinical consultation, greater  than 50% of which was counseling/coordinating care for percutaneous gastric tube placement

## 2018-03-03 LAB — RENAL FUNCTION PANEL
Albumin: 2.2 g/dL — ABNORMAL LOW (ref 3.5–5.0)
Anion gap: 15 (ref 5–15)
BUN: 118 mg/dL — AB (ref 8–23)
CALCIUM: 7.8 mg/dL — AB (ref 8.9–10.3)
CO2: 22 mmol/L (ref 22–32)
Chloride: 106 mmol/L (ref 98–111)
Creatinine, Ser: 1.53 mg/dL — ABNORMAL HIGH (ref 0.44–1.00)
GFR calc Af Amer: 36 mL/min — ABNORMAL LOW (ref >60)
GFR calc non Af Amer: 31 mL/min — ABNORMAL LOW (ref >60)
Glucose, Bld: 111 mg/dL — ABNORMAL HIGH (ref 70–99)
Phosphorus: 6 mg/dL — ABNORMAL HIGH (ref 2.5–4.6)
Potassium: 4.1 mmol/L (ref 3.5–5.1)
Sodium: 143 mmol/L (ref 135–145)

## 2018-03-03 LAB — CBC
HCT: 25.7 % — ABNORMAL LOW (ref 36.0–46.0)
Hemoglobin: 7.9 g/dL — ABNORMAL LOW (ref 12.0–15.0)
MCH: 31.7 pg (ref 26.0–34.0)
MCHC: 30.7 g/dL (ref 30.0–36.0)
MCV: 103.2 fL — ABNORMAL HIGH (ref 80.0–100.0)
NRBC: 1 % — AB (ref 0.0–0.2)
Platelets: 86 10*3/uL — ABNORMAL LOW (ref 150–400)
RBC: 2.49 MIL/uL — ABNORMAL LOW (ref 3.87–5.11)
RDW: 16.7 % — ABNORMAL HIGH (ref 11.5–15.5)
WBC: 12.8 10*3/uL — ABNORMAL HIGH (ref 4.0–10.5)

## 2018-03-03 LAB — MAGNESIUM: Magnesium: 2.1 mg/dL (ref 1.7–2.4)

## 2018-03-04 LAB — BASIC METABOLIC PANEL
Anion gap: 14 (ref 5–15)
BUN: 125 mg/dL — ABNORMAL HIGH (ref 8–23)
CO2: 27 mmol/L (ref 22–32)
Calcium: 8 mg/dL — ABNORMAL LOW (ref 8.9–10.3)
Chloride: 104 mmol/L (ref 98–111)
Creatinine, Ser: 1.6 mg/dL — ABNORMAL HIGH (ref 0.44–1.00)
GFR calc Af Amer: 34 mL/min — ABNORMAL LOW (ref >60)
GFR calc non Af Amer: 29 mL/min — ABNORMAL LOW (ref >60)
Glucose, Bld: 114 mg/dL — ABNORMAL HIGH (ref 70–99)
Potassium: 3.6 mmol/L (ref 3.5–5.1)
Sodium: 145 mmol/L (ref 135–145)

## 2018-03-04 LAB — MAGNESIUM: Magnesium: 2.1 mg/dL (ref 1.7–2.4)

## 2018-03-04 LAB — PHOSPHORUS: Phosphorus: 6.7 mg/dL — ABNORMAL HIGH (ref 2.5–4.6)

## 2018-03-05 ENCOUNTER — Encounter (HOSPITAL_COMMUNITY): Payer: Self-pay | Admitting: Interventional Radiology

## 2018-03-05 ENCOUNTER — Other Ambulatory Visit (HOSPITAL_COMMUNITY): Payer: Medicare Other

## 2018-03-05 HISTORY — PX: IR GASTROSTOMY TUBE MOD SED: IMG625

## 2018-03-05 LAB — CBC
HCT: 25.6 % — ABNORMAL LOW (ref 36.0–46.0)
Hemoglobin: 7.8 g/dL — ABNORMAL LOW (ref 12.0–15.0)
MCH: 31.6 pg (ref 26.0–34.0)
MCHC: 30.5 g/dL (ref 30.0–36.0)
MCV: 103.6 fL — ABNORMAL HIGH (ref 80.0–100.0)
Platelets: 79 10*3/uL — ABNORMAL LOW (ref 150–400)
RBC: 2.47 MIL/uL — ABNORMAL LOW (ref 3.87–5.11)
RDW: 17.4 % — AB (ref 11.5–15.5)
WBC: 8.4 10*3/uL (ref 4.0–10.5)
nRBC: 1.1 % — ABNORMAL HIGH (ref 0.0–0.2)

## 2018-03-05 LAB — BASIC METABOLIC PANEL
Anion gap: 14 (ref 5–15)
BUN: 131 mg/dL — ABNORMAL HIGH (ref 8–23)
CO2: 24 mmol/L (ref 22–32)
Calcium: 8.2 mg/dL — ABNORMAL LOW (ref 8.9–10.3)
Chloride: 106 mmol/L (ref 98–111)
Creatinine, Ser: 1.72 mg/dL — ABNORMAL HIGH (ref 0.44–1.00)
GFR calc Af Amer: 31 mL/min — ABNORMAL LOW (ref >60)
GFR calc non Af Amer: 27 mL/min — ABNORMAL LOW (ref >60)
Glucose, Bld: 113 mg/dL — ABNORMAL HIGH (ref 70–99)
Potassium: 3.3 mmol/L — ABNORMAL LOW (ref 3.5–5.1)
Sodium: 144 mmol/L (ref 135–145)

## 2018-03-05 LAB — PROTIME-INR
INR: 1.04
Prothrombin Time: 13.5 seconds (ref 11.4–15.2)

## 2018-03-05 MED ORDER — CEFAZOLIN SODIUM-DEXTROSE 2-4 GM/100ML-% IV SOLN
INTRAVENOUS | Status: AC
  Administered 2018-03-05: 2000 mg
  Filled 2018-03-05: qty 100

## 2018-03-05 MED ORDER — GLUCAGON HCL RDNA (DIAGNOSTIC) 1 MG IJ SOLR
INTRAMUSCULAR | Status: AC | PRN
  Administered 2018-03-05: 1 mg via INTRAVENOUS

## 2018-03-05 MED ORDER — MIDAZOLAM HCL 2 MG/2ML IJ SOLN
INTRAMUSCULAR | Status: AC | PRN
  Administered 2018-03-05: 0.5 mg via INTRAVENOUS

## 2018-03-05 MED ORDER — FENTANYL CITRATE (PF) 100 MCG/2ML IJ SOLN
INTRAMUSCULAR | Status: AC | PRN
  Administered 2018-03-05: 25 ug via INTRAVENOUS

## 2018-03-05 MED ORDER — MIDAZOLAM HCL 2 MG/2ML IJ SOLN
INTRAMUSCULAR | Status: AC
  Filled 2018-03-05: qty 2

## 2018-03-05 MED ORDER — LIDOCAINE HCL (PF) 1 % IJ SOLN
INTRAMUSCULAR | Status: AC | PRN
  Administered 2018-03-05: 10 mL

## 2018-03-05 MED ORDER — GLUCAGON HCL RDNA (DIAGNOSTIC) 1 MG IJ SOLR
INTRAMUSCULAR | Status: AC
  Filled 2018-03-05: qty 1

## 2018-03-05 MED ORDER — IOPAMIDOL (ISOVUE-300) INJECTION 61%
INTRAVENOUS | Status: AC
  Administered 2018-03-05: 20 mL
  Filled 2018-03-05: qty 50

## 2018-03-05 MED ORDER — FENTANYL CITRATE (PF) 100 MCG/2ML IJ SOLN
INTRAMUSCULAR | Status: AC
  Filled 2018-03-05: qty 2

## 2018-03-05 MED ORDER — LIDOCAINE HCL 1 % IJ SOLN
INTRAMUSCULAR | Status: AC
  Filled 2018-03-05: qty 20

## 2018-03-05 NOTE — Procedures (Signed)
Interventional Radiology Procedure Note  Procedure: Placement of percutaneous 20F pull-through gastrostomy tube. Complications: None Recommendations: - NPO except for sips and chips remainder of today and overnight - Maintain G-tube to LWS until tomorrow morning  - May advance diet as tolerated and begin using tube tomorrow morning  Signed,  Heath K. McCullough, MD   

## 2018-03-06 LAB — BASIC METABOLIC PANEL
Anion gap: 11 (ref 5–15)
BUN: 119 mg/dL — ABNORMAL HIGH (ref 8–23)
CALCIUM: 8.1 mg/dL — AB (ref 8.9–10.3)
CO2: 28 mmol/L (ref 22–32)
Chloride: 109 mmol/L (ref 98–111)
Creatinine, Ser: 1.72 mg/dL — ABNORMAL HIGH (ref 0.44–1.00)
GFR calc Af Amer: 31 mL/min — ABNORMAL LOW (ref >60)
GFR calc non Af Amer: 27 mL/min — ABNORMAL LOW (ref >60)
Glucose, Bld: 121 mg/dL — ABNORMAL HIGH (ref 70–99)
Potassium: 3.8 mmol/L (ref 3.5–5.1)
Sodium: 148 mmol/L — ABNORMAL HIGH (ref 135–145)

## 2018-03-06 LAB — CBC
HCT: 27.5 % — ABNORMAL LOW (ref 36.0–46.0)
Hemoglobin: 8.2 g/dL — ABNORMAL LOW (ref 12.0–15.0)
MCH: 31.8 pg (ref 26.0–34.0)
MCHC: 29.8 g/dL — ABNORMAL LOW (ref 30.0–36.0)
MCV: 106.6 fL — ABNORMAL HIGH (ref 80.0–100.0)
PLATELETS: 82 10*3/uL — AB (ref 150–400)
RBC: 2.58 MIL/uL — ABNORMAL LOW (ref 3.87–5.11)
RDW: 17.2 % — AB (ref 11.5–15.5)
WBC: 7 10*3/uL (ref 4.0–10.5)
nRBC: 0.4 % — ABNORMAL HIGH (ref 0.0–0.2)

## 2018-03-06 LAB — MAGNESIUM: Magnesium: 2.1 mg/dL (ref 1.7–2.4)

## 2018-03-06 NOTE — Progress Notes (Signed)
Referring Physician(s): Dr. Sharyon MedicusHijazi  Supervising Physician: Richarda OverlieHenn, Adam  [facility]  Chief Complaint: Follow-up successful gastrostomy tube placement in IR 03/05/18 by Dr. Archer AsaMcCullough  Subjective:  No complaints per staff - tube working fine as far as they know. Patient unable to provide history due to AMS.   Allergies: Patient has no allergy information on record.  Medications: Prior to Admission medications   Not on File     Vital Signs: BP 106/84 (BP Location: Left Arm)   Pulse (!) 106   Resp 18   LMP  (LMP Unknown)   SpO2 100%   Physical Exam HENT:     Head: Normocephalic and atraumatic.  Abdominal:     Palpations: Abdomen is soft.     Comments: (+) gastrostomy tube - insertion site clean, dry, gauze between bumper and skin. No discharge or leakage noted.   Skin:    General: Skin is warm and dry.  Neurological:     Mental Status: She is alert. Mental status is at baseline.     Imaging: Ir Gastrostomy Tube Mod Sed  Result Date: 03/05/2018 INDICATION: 84 year old female with dysphagia and malnutrition in need of percutaneous gastrostomy tube placement. EXAM: Fluoroscopically guided placement of percutaneous pull-through gastrostomy tube Interventional Radiologist:  Sterling BigHeath K. McCullough, MD MEDICATIONS: 2 g Ancef; Antibiotics were administered within 1 hour of the procedure. 1 mg glucagon also administered intravenously. ANESTHESIA/SEDATION: Versed 0.25 mg IV; Fentanyl 25 mcg IV Moderate Sedation Time:  10 minutes The patient was continuously monitored during the procedure by the interventional radiology nurse under my direct supervision. CONTRAST:  20 mL Isovue FLUOROSCOPY TIME:  Fluoroscopy Time: 1 minutes 24 seconds (13 mGy). COMPLICATIONS: None immediate. PROCEDURE: Informed written consent was obtained from the patient after a thorough discussion of the procedural risks, benefits and alternatives. All questions were addressed. Maximal Sterile  Barrier Technique was utilized including caps, mask, sterile gowns, sterile gloves, sterile drape, hand hygiene and skin antiseptic. A timeout was performed prior to the initiation of the procedure. Maximal barrier sterile technique utilized including caps, mask, sterile gowns, sterile gloves, large sterile drape, hand hygiene, and chlorhexadine skin prep. An angled catheter was advanced over a wire under fluoroscopic guidance through the nose, down the esophagus and into the body of the stomach. The stomach was then insufflated with several 100 ml of air. Fluoroscopy confirmed location of the gastric bubble, as well as inferior displacement of the barium stained colon. Under direct fluoroscopic guidance, a single T-tack was placed, and the anterior gastric wall drawn up against the anterior abdominal wall. Percutaneous access was then obtained into the mid gastric body with an 18 gauge sheath needle. Aspiration of air, and injection of contrast material under fluoroscopy confirmed needle placement. An Amplatz wire was advanced in the gastric body and the access needle exchanged for a 9-French vascular sheath. A snare device was advanced through the vascular sheath and an Amplatz wire advanced through the angled catheter. The Amplatz wire was successfully snared and this was pulled up through the esophagus and out the mouth. A 20-French Burnell BlanksKimberly Clark MIC-PEG tube was then connected to the snare and pulled through the mouth, down the esophagus, into the stomach and out to the anterior abdominal wall. Hand injection of contrast material confirmed intragastric location. The T-tack retention suture was then cut. The pull through peg tube was then secured with the external bumper and capped. The patient will be observed for several hours with the newly placed  tube on low wall suction to evaluate for any post procedure complication. The patient tolerated the procedure well, there is no immediate complication. IMPRESSION:  Successful placement of a 20 French pull through gastrostomy tube. Electronically Signed   By: Malachy MoanHeath  McCullough M.D.   On: 03/05/2018 10:41   Dg Chest Port 1 View  Result Date: 03/02/2018 CLINICAL DATA:  Follow-up pleural effusions EXAM: PORTABLE CHEST 1 VIEW COMPARISON:  02/23/2018 FINDINGS: No focal consolidation, pleural effusion or pneumothorax. Calcified right upper lobe pulmonary nodule likely reflecting sequela prior granulomatous disease. Bilateral mild interstitial thickening. Stable cardiomegaly. Right-sided PICC line with the tip projecting over the cavoatrial junction. Nasogastric tube coursing below the diaphragm. IMPRESSION: Cardiomegaly with mild pulmonary vascular congestion. Electronically Signed   By: Elige KoHetal  Patel   On: 03/02/2018 12:17    Labs:  CBC: Recent Labs    03/01/18 0424 03/03/18 0446 03/05/18 0456 03/06/18 0549  WBC 13.9* 12.8* 8.4 7.0  HGB 8.6* 7.9* 7.8* 8.2*  HCT 29.0* 25.7* 25.6* 27.5*  PLT 91* 86* 79* 82*    COAGS: Recent Labs    02/18/18 0507 03/05/18 0456  INR 1.92 1.04    BMP: Recent Labs    03/03/18 0446 03/04/18 0601 03/05/18 0456 03/06/18 0549  NA 143 145 144 148*  K 4.1 3.6 3.3* 3.8  CL 106 104 106 109  CO2 22 27 24 28   GLUCOSE 111* 114* 113* 121*  BUN 118* 125* 131* 119*  CALCIUM 7.8* 8.0* 8.2* 8.1*  CREATININE 1.53* 1.60* 1.72* 1.72*  GFRNONAA 31* 29* 27* 27*  GFRAA 36* 34* 31* 31*    LIVER FUNCTION TESTS: Recent Labs    02/18/18 0507  02/25/18 0700 02/26/18 0636 03/01/18 0424 03/03/18 0446  BILITOT 1.2  --   --   --   --   --   AST 48*  --   --   --   --   --   ALT 225*  --   --   --   --   --   ALKPHOS 58  --   --   --   --   --   PROT 5.4*  --   --   --   --   --   ALBUMIN 2.7*   < > 2.3* 2.4* 2.5* 2.2*   < > = values in this interval not displayed.    Assessment and Plan:  S/p successful gastrostomy tube placement 03/05/18 by Dr. Archer AsaMcCullough. No concerns per staff, tube working appropriately. Insertion  site unremarkable. May use tube for feeds and meds.  Please call IR with questions or concerns.   Electronically Signed: Villa HerbShannon A , PA-C 03/06/2018, 10:41 AM   I spent a total of 15 Minutes at the the patient's bedside AND on the patient's hospital floor or unit, greater than 50% of which was counseling/coordinating care for follow up gastrostomy tube placement.

## 2018-03-07 LAB — COMPREHENSIVE METABOLIC PANEL
ALT: 26 U/L (ref 0–44)
ANION GAP: 10 (ref 5–15)
AST: 22 U/L (ref 15–41)
Albumin: 2.2 g/dL — ABNORMAL LOW (ref 3.5–5.0)
Alkaline Phosphatase: 51 U/L (ref 38–126)
BUN: 125 mg/dL — ABNORMAL HIGH (ref 8–23)
CO2: 27 mmol/L (ref 22–32)
Calcium: 8.2 mg/dL — ABNORMAL LOW (ref 8.9–10.3)
Chloride: 109 mmol/L (ref 98–111)
Creatinine, Ser: 1.62 mg/dL — ABNORMAL HIGH (ref 0.44–1.00)
GFR calc Af Amer: 33 mL/min — ABNORMAL LOW (ref >60)
GFR calc non Af Amer: 29 mL/min — ABNORMAL LOW (ref >60)
Glucose, Bld: 150 mg/dL — ABNORMAL HIGH (ref 70–99)
Potassium: 4.2 mmol/L (ref 3.5–5.1)
Sodium: 146 mmol/L — ABNORMAL HIGH (ref 135–145)
Total Bilirubin: 0.6 mg/dL (ref 0.3–1.2)
Total Protein: 4.7 g/dL — ABNORMAL LOW (ref 6.5–8.1)

## 2018-03-07 LAB — CBC
HCT: 27.2 % — ABNORMAL LOW (ref 36.0–46.0)
Hemoglobin: 7.8 g/dL — ABNORMAL LOW (ref 12.0–15.0)
MCH: 30.7 pg (ref 26.0–34.0)
MCHC: 28.7 g/dL — ABNORMAL LOW (ref 30.0–36.0)
MCV: 107.1 fL — ABNORMAL HIGH (ref 80.0–100.0)
PLATELETS: 93 10*3/uL — AB (ref 150–400)
RBC: 2.54 MIL/uL — ABNORMAL LOW (ref 3.87–5.11)
RDW: 17.5 % — ABNORMAL HIGH (ref 11.5–15.5)
WBC: 7.2 10*3/uL (ref 4.0–10.5)
nRBC: 1 % — ABNORMAL HIGH (ref 0.0–0.2)

## 2018-03-07 LAB — DIC (DISSEMINATED INTRAVASCULAR COAGULATION) PANEL
INR: 1
PROTHROMBIN TIME: 13.1 s (ref 11.4–15.2)

## 2018-03-07 LAB — DIC (DISSEMINATED INTRAVASCULAR COAGULATION)PANEL
D-Dimer, Quant: 2.87 ug/mL-FEU — ABNORMAL HIGH (ref 0.00–0.50)
Fibrinogen: 449 mg/dL (ref 210–475)
Platelets: 93 10*3/uL — ABNORMAL LOW (ref 150–400)
Smear Review: NONE SEEN
aPTT: 27 seconds (ref 24–36)

## 2018-03-07 LAB — DIGOXIN LEVEL: Digoxin Level: 3.1 ng/mL (ref 0.8–2.0)

## 2018-03-07 LAB — MAGNESIUM: MAGNESIUM: 2.2 mg/dL (ref 1.7–2.4)

## 2018-03-07 LAB — HEPARIN INDUCED PLATELET AB (HIT ANTIBODY): Heparin Induced Plt Ab: 0.127 OD (ref 0.000–0.400)

## 2018-03-08 ENCOUNTER — Other Ambulatory Visit (HOSPITAL_COMMUNITY): Payer: Medicare Other

## 2018-03-08 NOTE — Progress Notes (Signed)
[facility])  Received request for residential hospice at [facility].  Family in [place], pt from [place].  Unfortunately we do not have a bed to offer today.     Thank you, Wallis Bamberg BSN, RN [facility] Liaison (listed in [place]) [phone]

## 2018-03-09 NOTE — Progress Notes (Signed)
[facility]  Received referral for placement at [facility] and [facility] [place].  Currently, neither facilities have a bed to offer the patient at this time.  Spoke with Liaison for [place] and she advised that she will follow up with LCSW if a bed becomes available at [facility].  [facility] liaison will follow up daily as well.  Thank you for this referral, Wallis Bamberg BSN, RN [facility] Liaison (listed in [place]) [phone]

## 2018-04-07 DEATH — deceased

## 2019-11-15 IMAGING — DX DG ABD PORTABLE 1V
1 series · 2 of 2 positions shown · non-contrast
Comparison: February 25, 2018

CLINICAL DATA: Ileus

EXAM:
PORTABLE ABDOMEN - 1 VIEW

[Series 1: abdomen · 0.14mm/px · 2 of 2 slices shown]
[im 1/2]
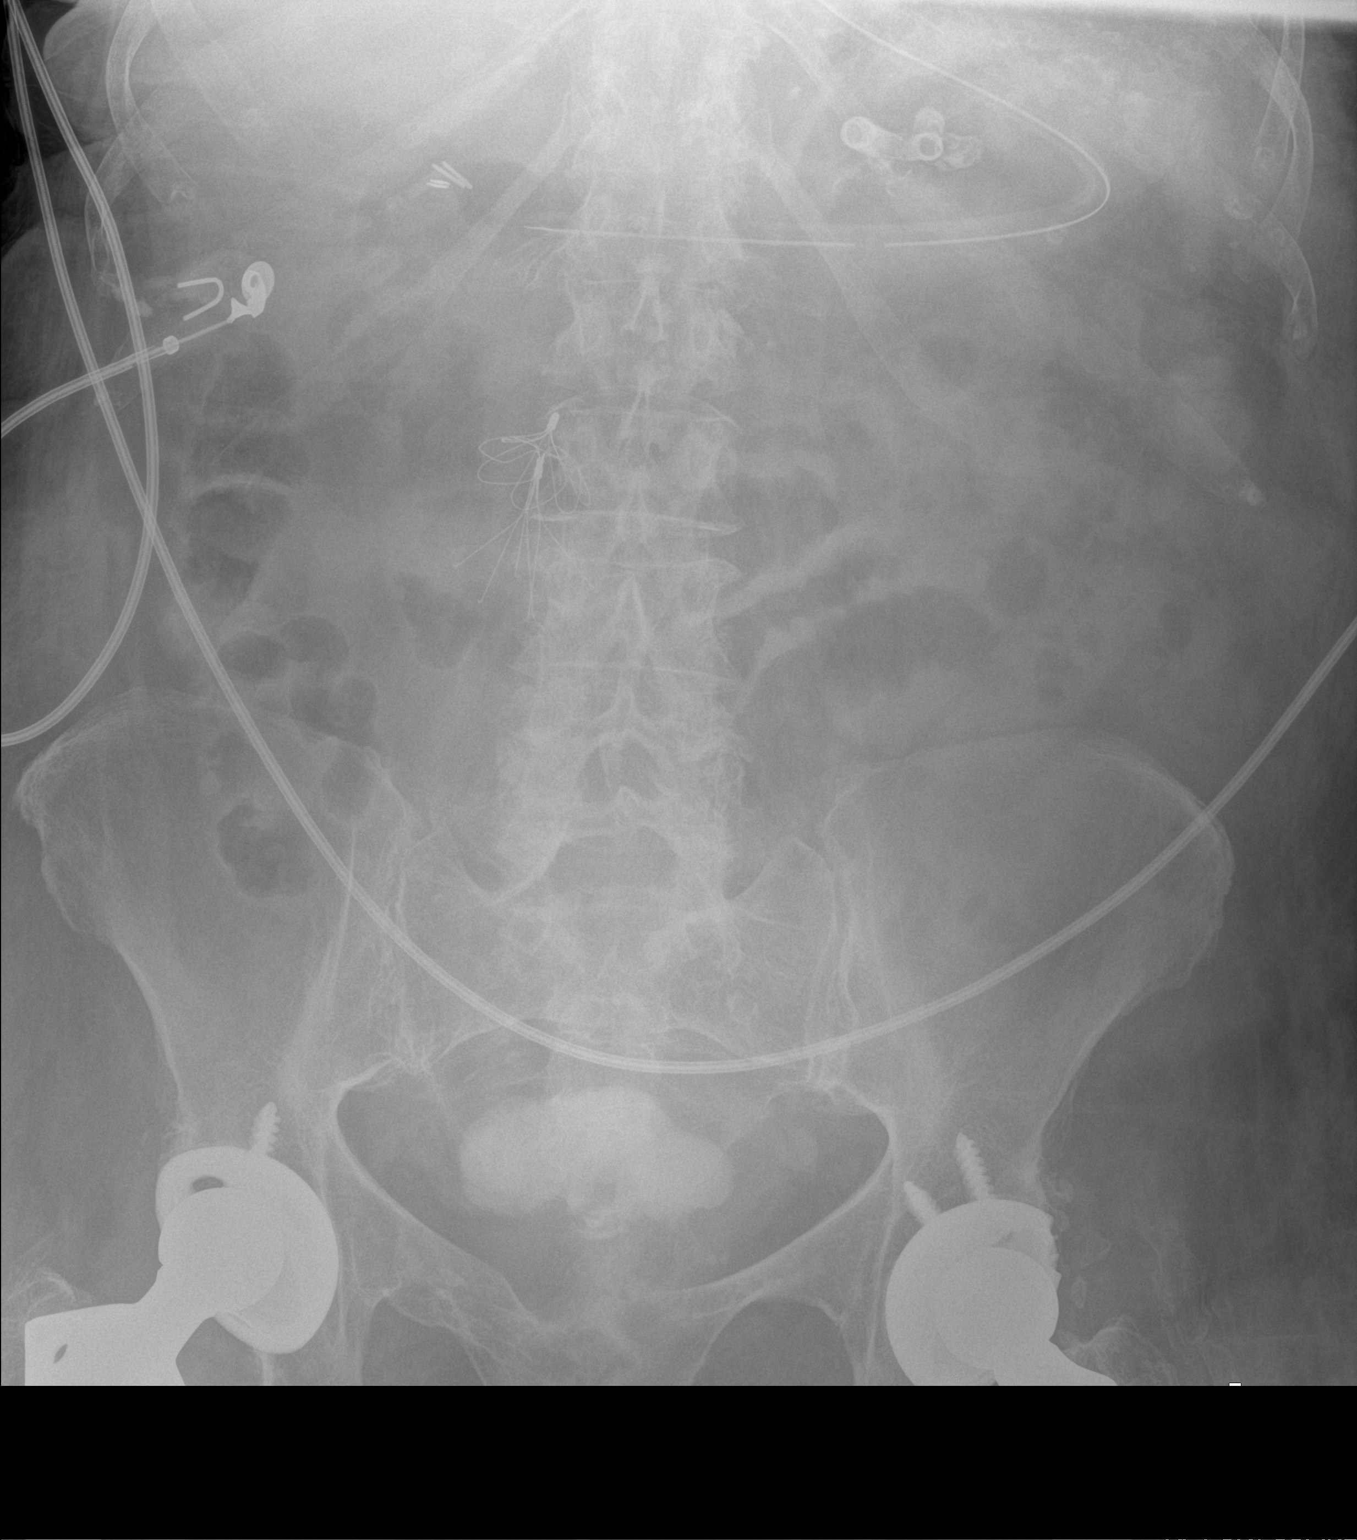
[im 2/2]
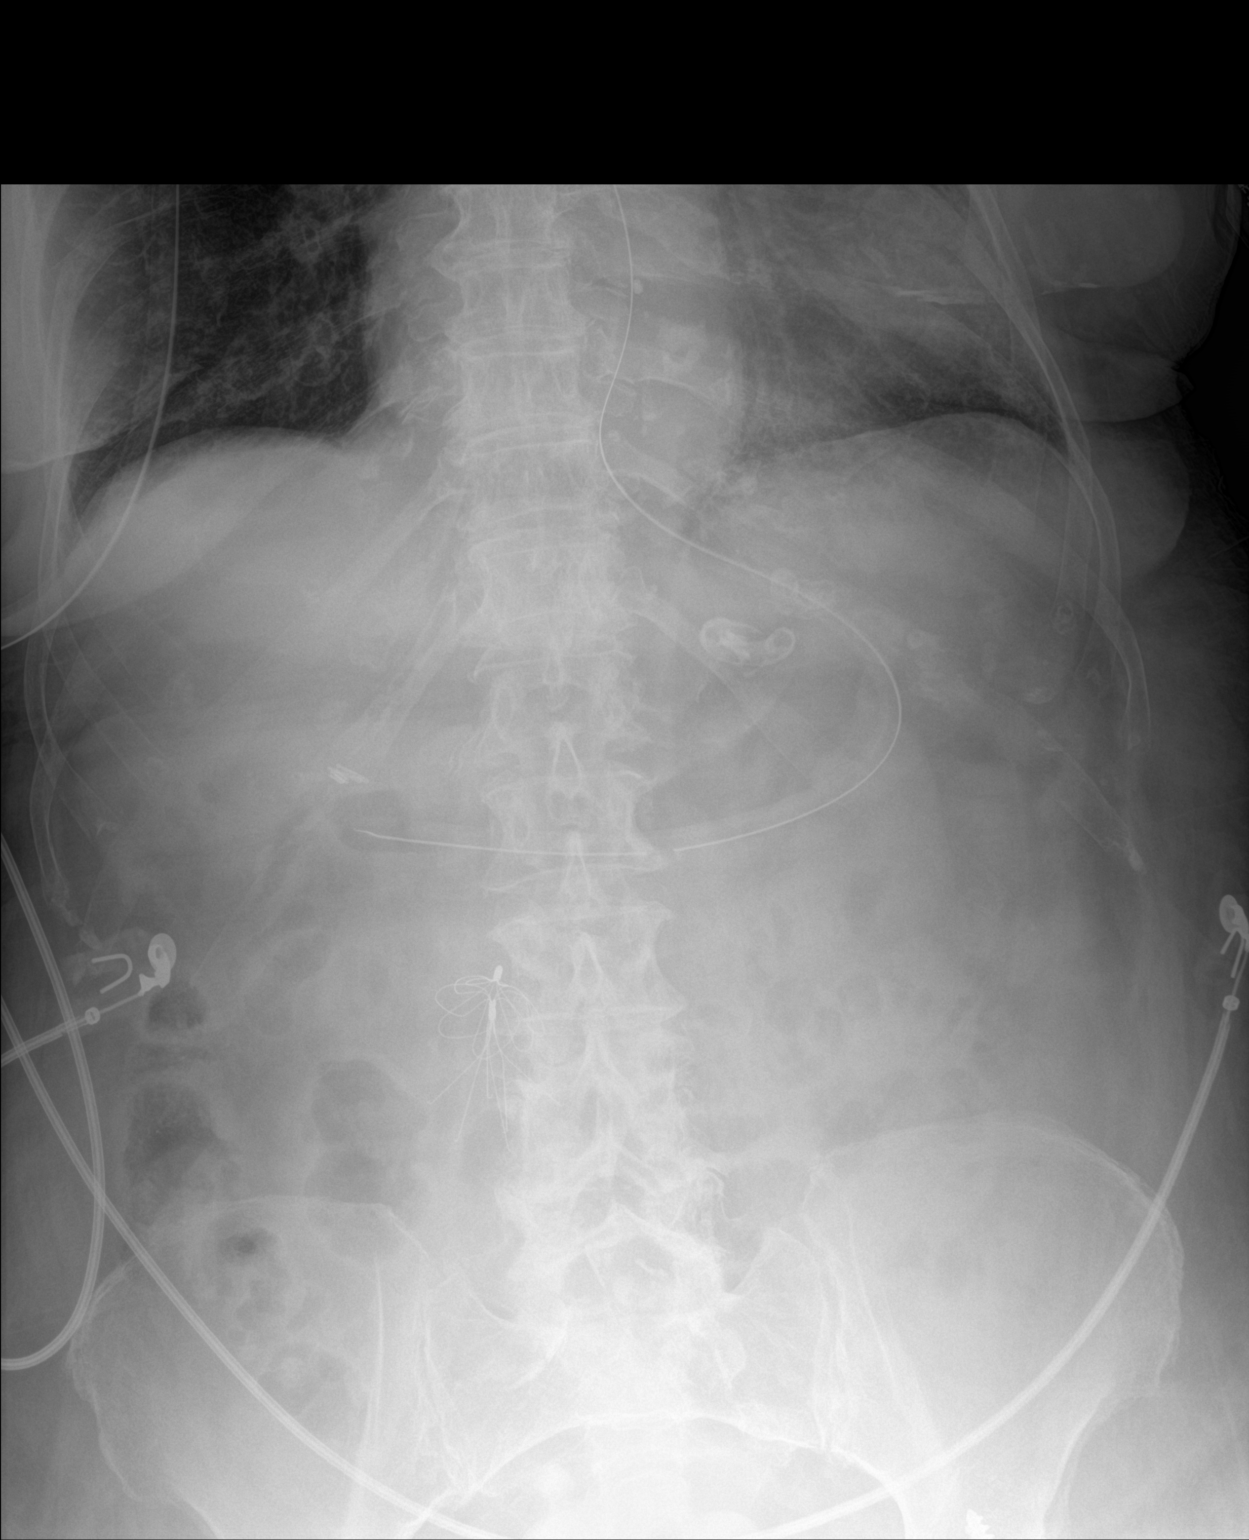

[2 of 2 positions shown; findings below may reference images not displayed]

FINDINGS: Nasogastric tube tip and side port are in the stomach. There is
currently no appreciable bowel dilatation or air-fluid level to
suggest bowel obstruction. No free air. Inferior vena cava position
unchanged. There are total hip replacements bilaterally. Lung bases
are clear. Calcified breast implants noted.
IMPRESSION: Nasogastric tube tip and side port in stomach. No bowel obstruction
or free air evident. Lung bases clear. Postoperative changes noted.

## 2019-11-18 IMAGING — CT CT ABDOMEN W/O CM
2 of 4 series · 14 of 46 positions shown, 16 images · non-contrast
Comparison: Recent prior CT scan of the abdomen and pelvis
02/20/2018

CLINICAL DATA: 87-year-old female with dysphagia in need of
percutaneous gastrostomy tube for nutrition. Evaluate for anatomic
suitability. Recent partial small bowel obstruction.

EXAM:
CT ABDOMEN WITHOUT CONTRAST
TECHNIQUE: Multidetector CT imaging of the abdomen was performed following the
standard protocol without IV contrast.

[Series 3: a/p w/o 5mm · axial · non-contrast · 0.91mm/px · z∈[+1195,+1420]mm · 11 of 55 slices shown, 13 images]
[im 5/55  soft-tissue]
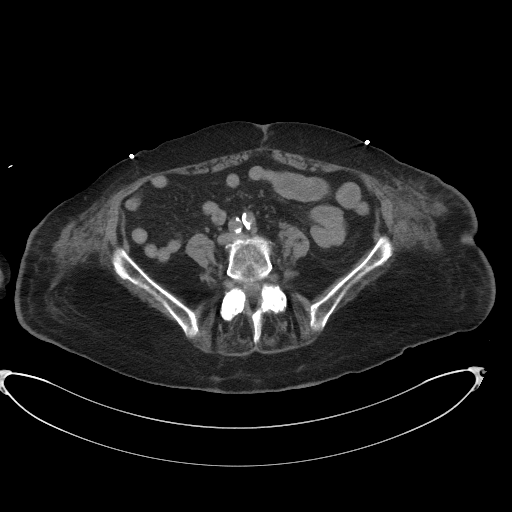
[im 5/55  bone]
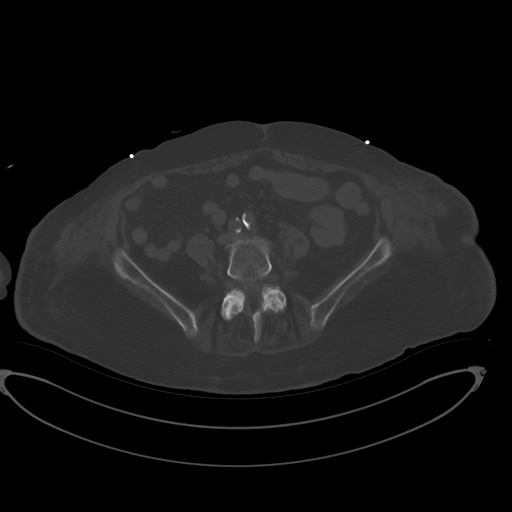
[im 10/55  soft-tissue]
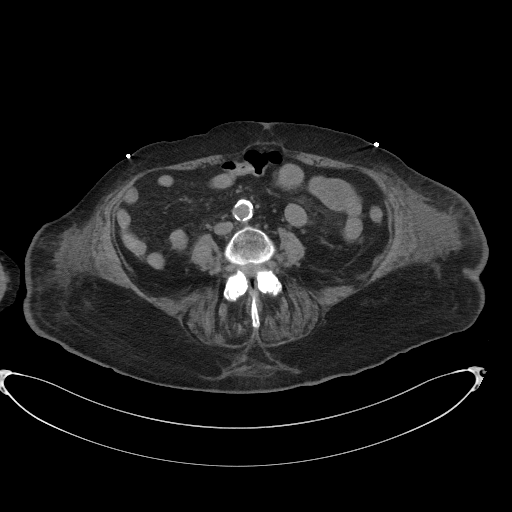
[im 14/55  soft-tissue]
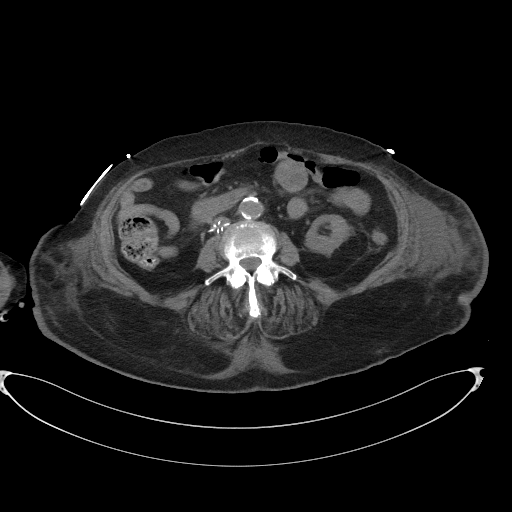
[im 19/55  soft-tissue]
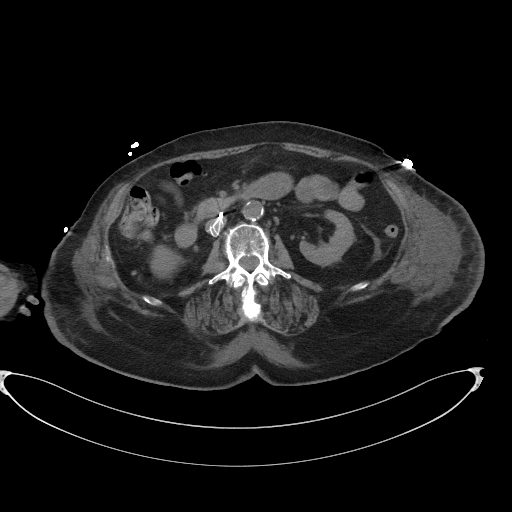
[im 23/55  soft-tissue]
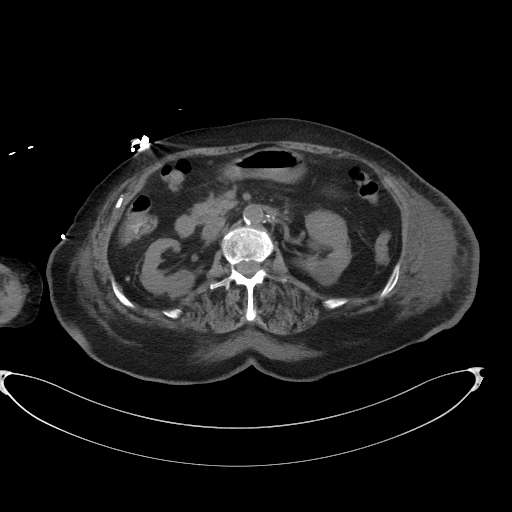
[im 28/55  soft-tissue]
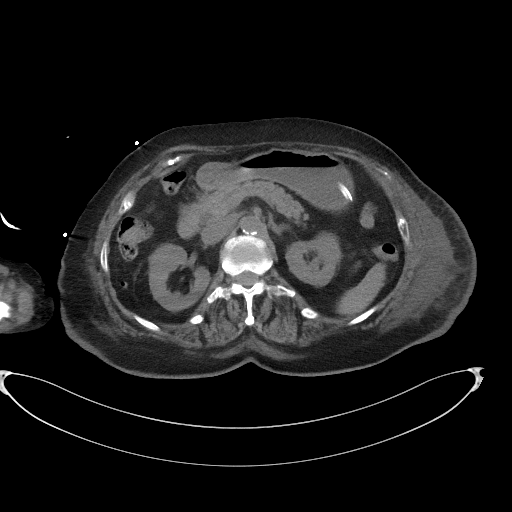
[im 32/55  soft-tissue]
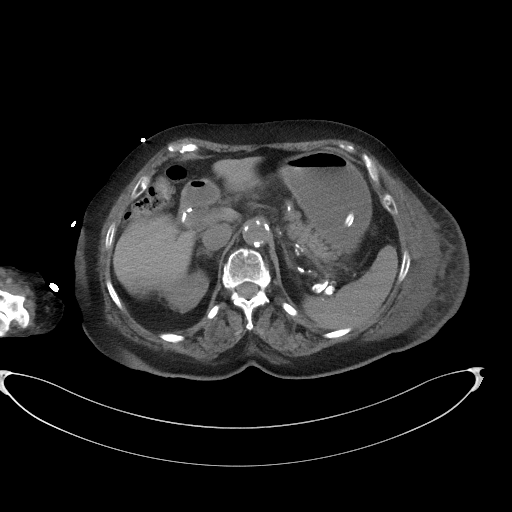
[im 37/55  soft-tissue]
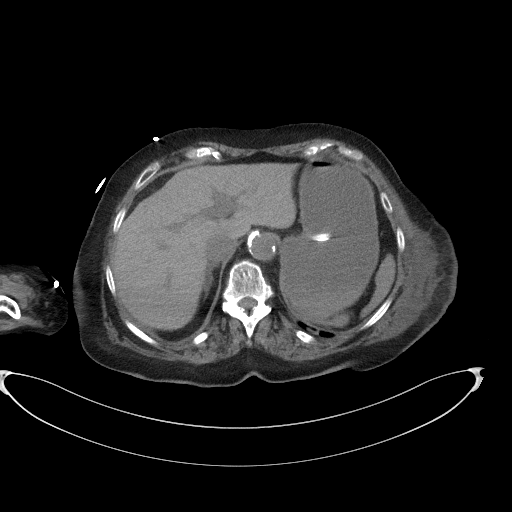
[im 41/55  soft-tissue]
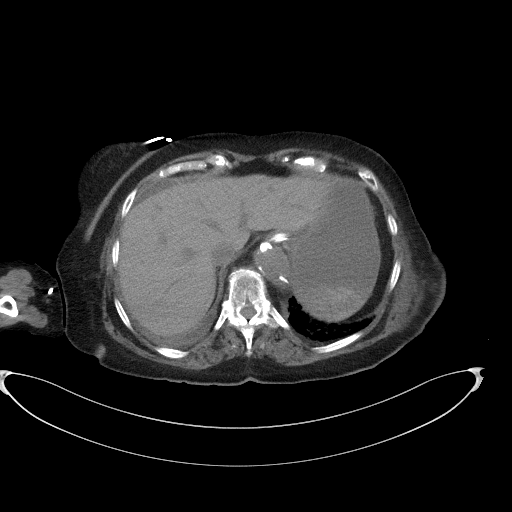
[im 41/55  bone]
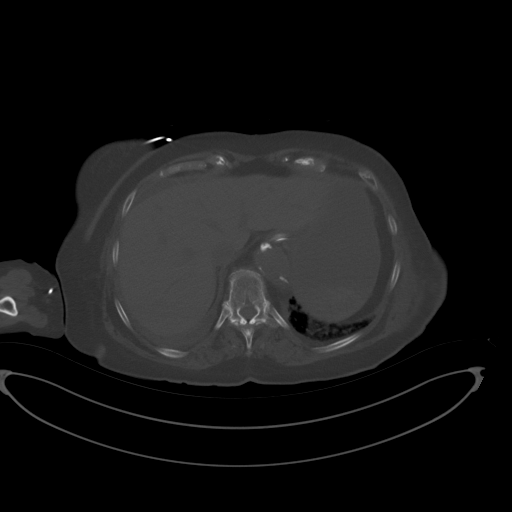
[im 46/55  soft-tissue]
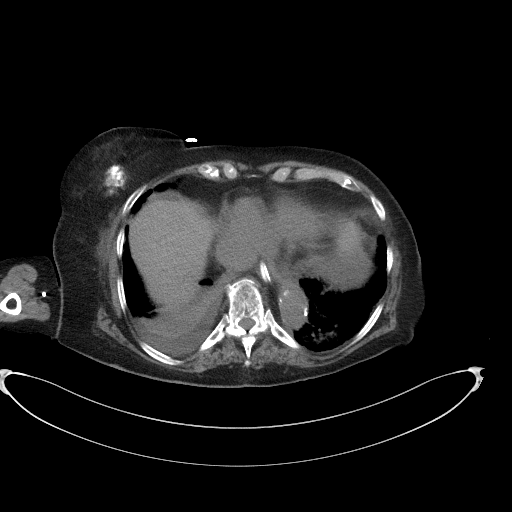
[im 50/55  soft-tissue]
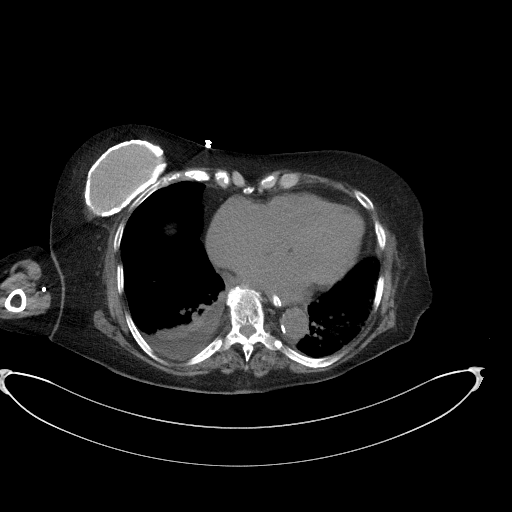

[Series 6: a/p w/o cor · coronal · non-contrast · 0.55mm/px · 3 of 136 slices shown]
[im 46/136  soft-tissue]
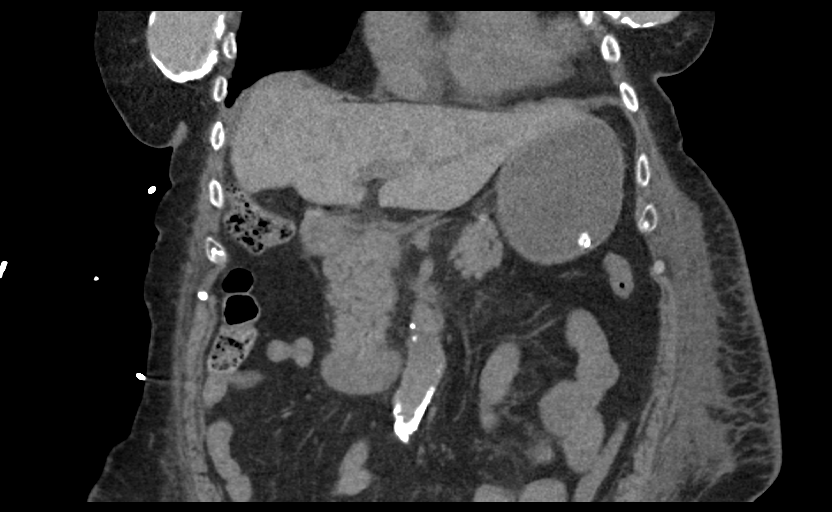
[im 61/136  soft-tissue]
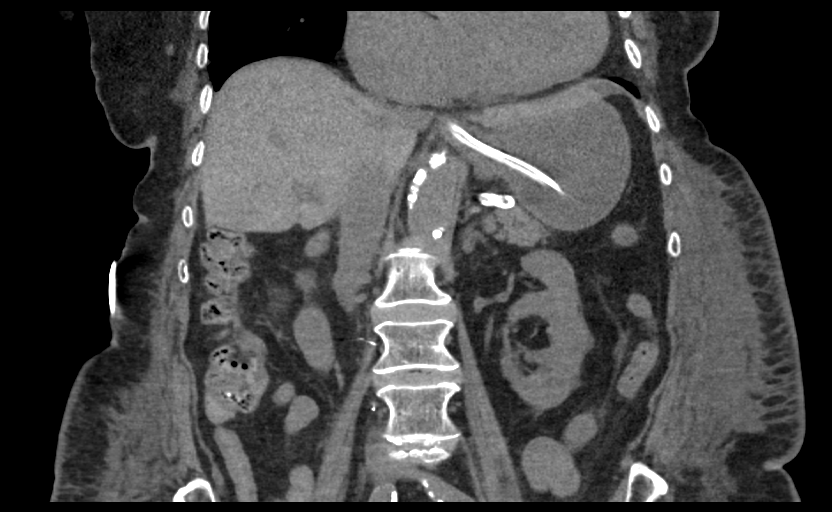
[im 76/136  soft-tissue]
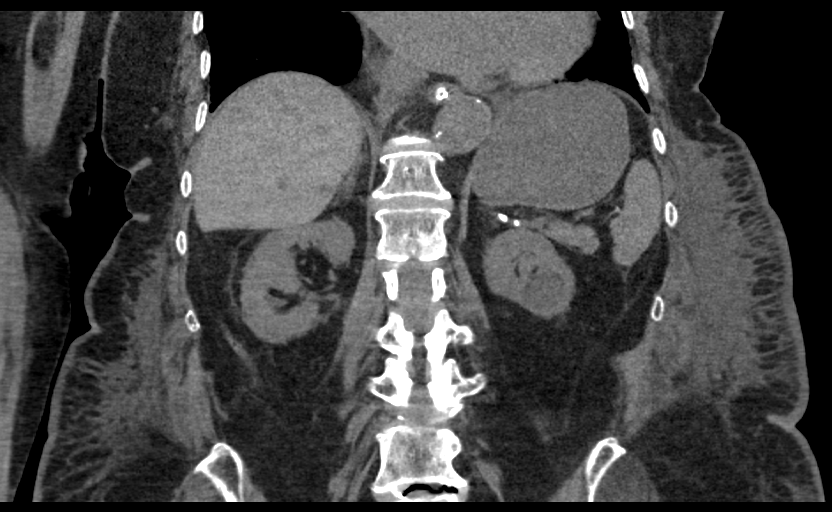

[14 of 46 positions shown; findings below may reference images not displayed]

FINDINGS: Lower chest: Small layering right pleural effusion. Expected
associated right lower lobe atelectasis. Patchy ground-glass
attenuation airspace opacity in the left lower lobe is new compared
to prior. Stable cardiomegaly with right atrial dilatation. The
visualized thoracic esophagus conveys a gastric tube into the
stomach. No esophageal thickening.

Hepatobiliary: No focal liver abnormality is seen. Status post
cholecystectomy. No biliary dilatation.

Pancreas: Unremarkable. No pancreatic ductal dilatation or
surrounding inflammatory changes.

Spleen: Normal in size without focal abnormality.

Adrenals/Urinary Tract: Normal adrenal glands. No evidence of
hydronephrosis or nephrolithiasis. Water attenuation cystic lesion
in the interpolar left kidney is incompletely evaluated in the
absence of intravenous contrast but statistically likely to
represent a benign cyst.

Stomach/Bowel: Interval resolution of small bowel obstruction.
Normal gastric anatomy. No focal bowel wall thickening. Normal
appendix in the right lower quadrant.

Vascular/Lymphatic: Limited evaluation in the absence of intravenous
contrast. Extensive atherosclerotic calcifications including of the
aorta. A non retrievable permanent Tomi Nitinol IVC filter is
present. There is penetration of the struts. No strut complication
visualized. No suspicious lymphadenopathy.

Other: Bilateral calcified breast augmentation prostheses. Probable
rupture of the left prosthesis with associated skin thickening.

Musculoskeletal: No acute fracture or aggressive appearing lytic or
blastic osseous lesion.
IMPRESSION: 1. Anatomy is suitable for gastrostomy tube placement.
2. New patchy ground-glass attenuation airspace opacity in the left
lower lobe concerning for bronchopneumonia versus aspiration.
3. Ruptured left breast augmentation prosthesis with reticulation of
the overlying subcutaneous fat and focal skin thickening. Recommend
clinical correlation for evidence of local irritation, cellulitis,
or mass/malignancy.
4. Non retrievable Tomi Nitinol IVC filter in place.
